# Patient Record
Sex: Male | Born: 1990 | Race: Black or African American | Hispanic: No | Marital: Single | Smoking: Current every day smoker
Health system: Southern US, Community
[De-identification: ages and names within clinical notes are randomized; demographics above are authoritative.]

---

## 2014-12-24 ENCOUNTER — Encounter (HOSPITAL_COMMUNITY): Payer: Self-pay | Admitting: Emergency Medicine

## 2014-12-24 ENCOUNTER — Emergency Department (HOSPITAL_COMMUNITY)
Admission: EM | Admit: 2014-12-24 | Discharge: 2014-12-24 | Disposition: A | Discharge status: Home / Self Care | Payer: Self-pay | Attending: Emergency Medicine | Admitting: Emergency Medicine

## 2014-12-24 DIAGNOSIS — R0602 Shortness of breath: Secondary | ICD-10-CM | POA: Insufficient documentation

## 2014-12-24 DIAGNOSIS — L819 Disorder of pigmentation, unspecified: Secondary | ICD-10-CM | POA: Insufficient documentation

## 2014-12-24 DIAGNOSIS — R109 Unspecified abdominal pain: Secondary | ICD-10-CM | POA: Insufficient documentation

## 2014-12-24 DIAGNOSIS — Z720 Tobacco use: Secondary | ICD-10-CM | POA: Insufficient documentation

## 2014-12-24 NOTE — Discharge Instructions (Signed)

## 2014-12-24 NOTE — ED Notes (Signed)
Patient states 2-3 weeks ago, perhaps as long ago as a month and a half ago, he was shaving with a dirty razor. Patient has small scar to inner right knee, states he believes he now has tetanus. Patient states the area is "spreading", he is having muscle contractions, and states he "almost had lockjaw" this morning. Patient states his last tetanus shot was just over 1 year ago.

## 2014-12-24 NOTE — ED Provider Notes (Signed)
CSN: [id number]     Arrival date & time 12/24/14  1911 History  This chart was scribed for non-physician provider Joycie PeekBenjamin Markies Mowatt, PA-C, working with Rolland PorterMark James, MD by Phillis HaggisGabriella Gaje, ED Scribe. This patient was seen in room WTR9/WTR9 and patient care was started at 7:40 PM.    Chief Complaint  Patient presents with  . Wound Check    left knee   Patient is a 24 y.o. male presenting with wound check. The history is provided by the patient. No language interpreter was used.  Wound Check Associated symptoms include abdominal pain and shortness of breath.   HPI Comments: Lawrence ReachChristopher Bean is a 24 y.o. male who presents to the Emergency Department requesting a wound check. He states that he accidentally cut himself shaving with a dirty razor in the inner right knee a month and a half ago and has since been experiencing muscle spasms, itching, and believes he experienced lockjaw this morning. He states that the scarred area has been spreading behind his knee. He states that he believes he has tetanus. Patient states that his last tetanus shot was one year ago. He reports some SOB and slight abdominal pain but believes that this might be attributed to anxiety related to the complaint . Patient reports that he is a smoker. Patient denies fever, numbness, or weakness. Patient denies any significant medical history but reports familial history of Lupus.   History reviewed. No pertinent past medical history. History reviewed. No pertinent past surgical history. No family history on file. History  Substance Use Topics  . Smoking status: Current Every Day Smoker -- 0.30 packs/day    Types: Cigarettes  . Smokeless tobacco: Not on file  . Alcohol Use: Yes     Comment: social    Review of Systems  Constitutional: Negative for fever.  Respiratory: Positive for shortness of breath.   Gastrointestinal: Positive for abdominal pain.  Skin: Positive for wound.  Neurological: Negative for weakness and  numbness.  All other systems reviewed and are negative.  Allergies  Review of patient's allergies indicates no known allergies.  Home Medications   Prior to Admission medications   Not on File   BP 146/96 mmHg  Pulse 106  Temp(Src) 98.6 F (37 C) (Oral)  Resp 20  SpO2 100%  Physical Exam  Constitutional: He is oriented to person, place, and time. He appears well-developed and well-nourished. No distress.  HENT:  Head: Normocephalic and atraumatic.  Mouth/Throat: Oropharynx is clear and moist. No oropharyngeal exudate.  No trismus.  Eyes: Conjunctivae and EOM are normal. Right eye exhibits no discharge. Left eye exhibits no discharge. No scleral icterus.  Neck: Normal range of motion. Neck supple.  Cardiovascular: Normal rate, regular rhythm and normal heart sounds.   Pulmonary/Chest: Effort normal and breath sounds normal. No respiratory distress. He has no wheezes. He has no rales.  Abdominal: Soft. There is no tenderness.  Musculoskeletal: Normal range of motion. He exhibits no edema or tenderness.  Neurological: He is alert and oriented to person, place, and time.  CN 2-12 grossly intact. Motor and Sensation baseline with no focal deficits.   Skin: Skin is warm and dry.  Small lesions <0.5cm with Mild hyperpigmentation to medial aspect of L knee. No erythema, edema, warmth.  Psychiatric: He has a normal mood and affect. His behavior is normal.  Pt appears anxious and nervous.  Nursing note and vitals reviewed.   ED Course  Procedures (including critical care time)  DIAGNOSTIC STUDIES: Oxygen Saturation  is 100% on room air, normal by my interpretation.    COORDINATION OF CARE: 7:45 PM-Discussed treatment plan which includes increasing intake of electrolytes with pt at bedside and pt agreed to plan.   Labs Review Labs Reviewed - No data to display  Imaging Review No results found.   EKG Interpretation None     Filed Vitals:   12/24/14 1916  BP: 146/96   Pulse: 106  Temp: 98.6 F (37 C)  TempSrc: Oral  Resp: 20  SpO2: 100%   Meds given in ED:  Medications - No data to display  There are no discharge medications for this patient.   MDM  Vitals stable - WNL -afebrile, no tachycardia on my exam. HR 90s. Previous tachycardia likely due to anxiety. Pt resting comfortably in ED. PE--Mild hyperpigmentation to medial aspect of L knee. Consistent with mild scarring. No evidence of infection. Full ROM. NVI  DDX--pt concerned about tetanus exposure over 1.5 months ago. Last Tetanus 1 yr ago. No trismus on exam. No spasm. No evidence of tetanus or other emergent pathology. DC with resource guide so pt may f/u with PCP.  I discussed all relevant lab findings and imaging results with pt and they verbalized understanding. Discussed f/u with PCP within 48 hrs and return precautions, pt very amenable to plan. Pt stable, in good condition and is appropriate for discharge.  Final diagnoses:  Pigmented skin lesion    I personally performed the services described in this documentation, which was scribed in my presence. The recorded information has been reviewed and is accurate.    Joycie Peek, PA-C 12/25/14 1348  Rolland Porter, MD 01/03/15 [phone]

## 2015-01-09 ENCOUNTER — Encounter (HOSPITAL_COMMUNITY): Payer: Self-pay

## 2015-01-09 ENCOUNTER — Emergency Department (HOSPITAL_COMMUNITY): Payer: BLUE CROSS/BLUE SHIELD

## 2015-01-09 ENCOUNTER — Emergency Department (HOSPITAL_COMMUNITY)
Admission: EM | Admit: 2015-01-09 | Discharge: 2015-01-10 | Disposition: A | Discharge status: Home / Self Care | Payer: BLUE CROSS/BLUE SHIELD | Attending: Emergency Medicine | Admitting: Emergency Medicine

## 2015-01-09 DIAGNOSIS — R079 Chest pain, unspecified: Secondary | ICD-10-CM

## 2015-01-09 DIAGNOSIS — R0789 Other chest pain: Secondary | ICD-10-CM | POA: Insufficient documentation

## 2015-01-09 DIAGNOSIS — Z720 Tobacco use: Secondary | ICD-10-CM | POA: Diagnosis not present

## 2015-01-09 LAB — I-STAT CHEM 8, ED
BUN: 14 mg/dL (ref 6–23)
CALCIUM ION: 1.08 mmol/L — AB (ref 1.12–1.23)
CREATININE: 0.9 mg/dL (ref 0.50–1.35)
Chloride: 100 mmol/L (ref 96–112)
GLUCOSE: 69 mg/dL — AB (ref 70–99)
HCT: 46 % (ref 39.0–52.0)
HEMOGLOBIN: 15.6 g/dL (ref 13.0–17.0)
Potassium: 3.4 mmol/L — ABNORMAL LOW (ref 3.5–5.1)
Sodium: 136 mmol/L (ref 135–145)
TCO2: 18 mmol/L (ref 0–100)

## 2015-01-09 LAB — I-STAT TROPONIN, ED: Troponin i, poc: 0 ng/mL (ref 0.00–0.08)

## 2015-01-09 LAB — CBC
HCT: 42.1 % (ref 39.0–52.0)
Hemoglobin: 14.2 g/dL (ref 13.0–17.0)
MCH: 31.8 pg (ref 26.0–34.0)
MCHC: 33.7 g/dL (ref 30.0–36.0)
MCV: 94.2 fL (ref 78.0–100.0)
PLATELETS: 235 10*3/uL (ref 150–400)
RBC: 4.47 MIL/uL (ref 4.22–5.81)
RDW: 13.3 % (ref 11.5–15.5)
WBC: 11.6 10*3/uL — ABNORMAL HIGH (ref 4.0–10.5)

## 2015-01-09 MED ORDER — GI COCKTAIL ~~LOC~~
30.0000 mL | Freq: Once | ORAL | Status: AC
  Administered 2015-01-09: 30 mL via ORAL
  Filled 2015-01-09: qty 30

## 2015-01-09 NOTE — ED Notes (Signed)
Patient transported to X-ray 

## 2015-01-09 NOTE — ED Notes (Signed)
MD at bedside. 

## 2015-01-09 NOTE — ED Notes (Signed)
Pt presents with c/o chest pain that started a few days ago and pain down his left arm. Pt denies any shortness of breath or dizziness.

## 2015-01-09 NOTE — ED Provider Notes (Signed)
TIME SEEN: 11:40 PM  CHIEF COMPLAINT: Chest pain  HPI: Pt is a 24 y.o. male with history of tobacco use who presents emergency department with left-sided chest pain that he describes as "gas pain" that radiates into his left arm that started at 2 PM today. No shortness of breath, nausea, vomiting, dizziness, diaphoresis. Has a family history of grandparents with MIs in their 340s. No other family history of premature CAD. Denies history of hypertension, diabetes, hyperlipidemia. No history of PE or DVT, recent prolonged immobilization such as long flight or hospitalization, fracture, surgery, trauma. No fever or cough. States he has been having indigestion and has been trying baking soda and water at home.  ROS: See HPI Constitutional: no fever  Eyes: no drainage  ENT: no runny nose   Cardiovascular:   chest pain  Resp: no SOB  GI: no vomiting GU: no dysuria Integumentary: no rash  Allergy: no hives  Musculoskeletal: no leg swelling  Neurological: no slurred speech ROS otherwise negative  PAST MEDICAL HISTORY/PAST SURGICAL HISTORY:  History reviewed. No pertinent past medical history.  MEDICATIONS:  Prior to Admission medications   Not on File    ALLERGIES:  No Known Allergies  SOCIAL HISTORY:  History  Substance Use Topics  . Smoking status: Current Every Day Smoker -- 0.30 packs/day    Types: Cigarettes  . Smokeless tobacco: Not on file  . Alcohol Use: Yes     Comment: social    FAMILY HISTORY: No family history on file.  EXAM: BP 131/88 mmHg  Pulse 95  Temp(Src) 98.2 F (36.8 C) (Oral)  Resp 11  SpO2 100% CONSTITUTIONAL: Alert and oriented and responds appropriately to questions. Well-appearing; well-nourished HEAD: Normocephalic EYES: Conjunctivae clear, PERRL ENT: normal nose; no rhinorrhea; moist mucous membranes; pharynx without lesions noted NECK: Supple, no meningismus, no LAD  CARD: RRR; S1 and S2 appreciated; no murmurs, no clicks, no rubs, no  gallops RESP: Normal chest excursion without splinting or tachypnea; breath sounds clear and equal bilaterally; no wheezes, no rhonchi, no rales, chest wall nontender to palpation without crepitus or ecchymosis or deformity ABD/GI: Normal bowel sounds; non-distended; soft, non-tender, no rebound, no guarding BACK:  The back appears normal and is non-tender to palpation, there is no CVA tenderness EXT: Normal ROM in all joints; non-tender to palpation; no edema; normal capillary refill; no cyanosis, no calf tenderness or swelling    SKIN: Normal color for age and race; warm NEURO: Moves all extremities equally PSYCH: The patient's mood and manner are appropriate. Grooming and personal hygiene are appropriate.  MEDICAL DECISION MAKING: Patient here with very atypical chest pain. He is PERC negative and has no risk factors for ACS other than tobacco use and family history. His EKG however does show some abnormalities but is likely secondary to early repolarization. Given pain is been constant since 2 PM we'll obtain one troponin, chest x-ray. We'll give GI cocktail given this is likely gastric in nature.  ED PROGRESS: Patient reports some improvement after GI cocktail. Troponin is negative. I do not feel he needs serial troponins given pain has been constant for several hours. Chest x-ray clear. Discharge with prescription for Protonix to use as needed. Discussed return precautions. He verbalizes understanding and is comfortable with plan.      EKG Interpretation  Date/Time:  Tuesday January 09 2015 23:16:57 EDT Ventricular Rate:  83 PR Interval:  121 QRS Duration: 101 QT Interval:  393 QTC Calculation: 462 R Axis:   65 Text  Interpretation:  Sinus rhythm Left ventricular hypertrophy J point elevation in diffuse leads No old for comparison Confirmed by Jeannemarie Sawaya,  DO, Aylen Rambert [phone]) on 01/09/2015 11:23:14 PM        Layla Maw Kamdyn Colborn, DO 01/10/15 1914

## 2015-01-10 LAB — BASIC METABOLIC PANEL
ANION GAP: 17 — AB (ref 5–15)
BUN: 15 mg/dL (ref 6–23)
CHLORIDE: 98 mmol/L (ref 96–112)
CO2: 21 mmol/L (ref 19–32)
Calcium: 9.3 mg/dL (ref 8.4–10.5)
Creatinine, Ser: 0.96 mg/dL (ref 0.50–1.35)
GFR calc non Af Amer: >90 mL/min (ref >90)
Glucose, Bld: 70 mg/dL (ref 70–99)
Potassium: 3.5 mmol/L (ref 3.5–5.1)
Sodium: 136 mmol/L (ref 135–145)

## 2015-01-10 MED ORDER — PANTOPRAZOLE SODIUM 40 MG PO TBEC: 40.0000 mg | DELAYED_RELEASE_TABLET | Freq: Every day | ORAL | Status: AC

## 2015-01-10 NOTE — Discharge Instructions (Signed)

## 2016-08-11 IMAGING — CR DG CHEST 2V
2 series · 2 of 2 positions shown · non-contrast
Comparison: None.

CLINICAL DATA: Chest pain

EXAM:
CHEST  2 VIEW

[w chest pa]
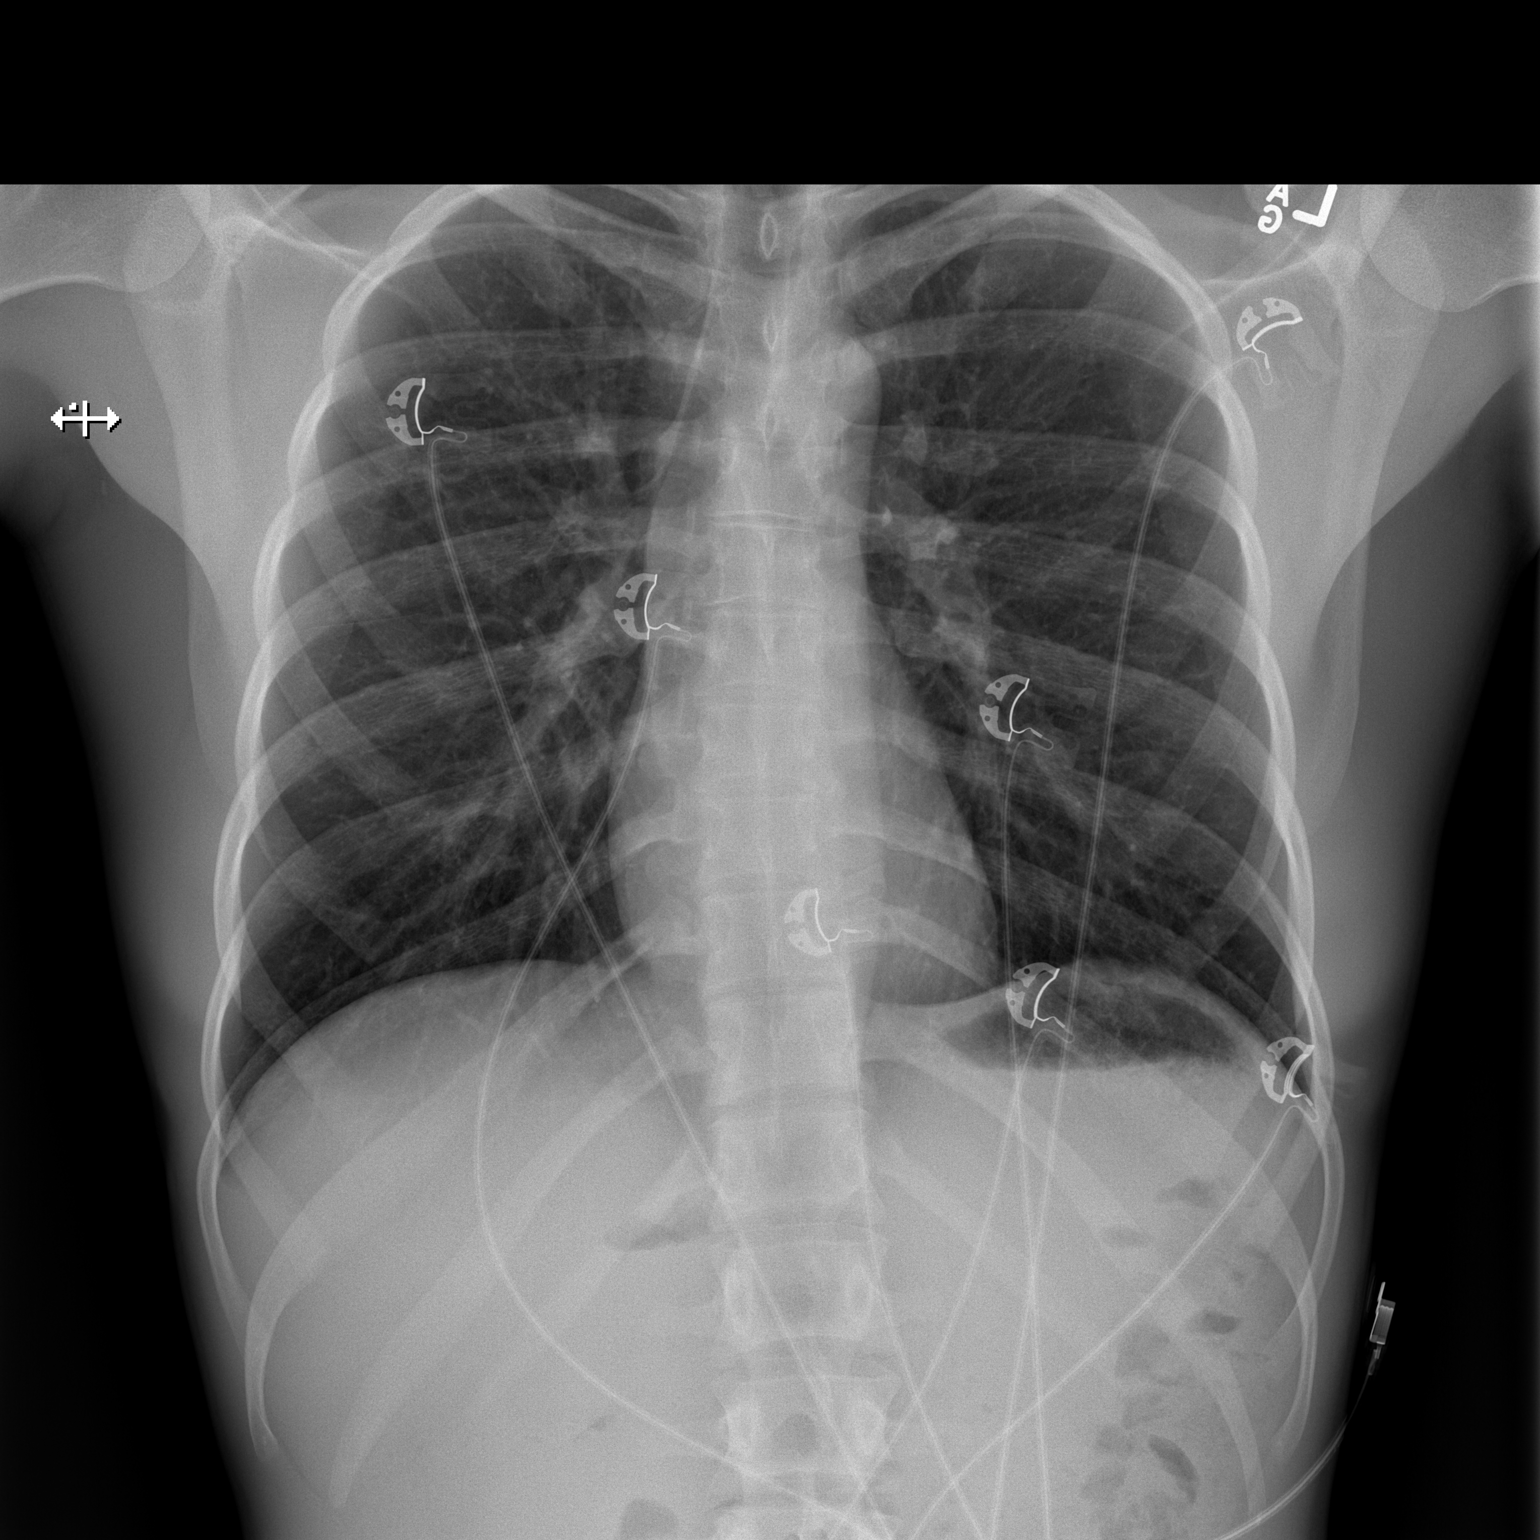

[w chest lat]
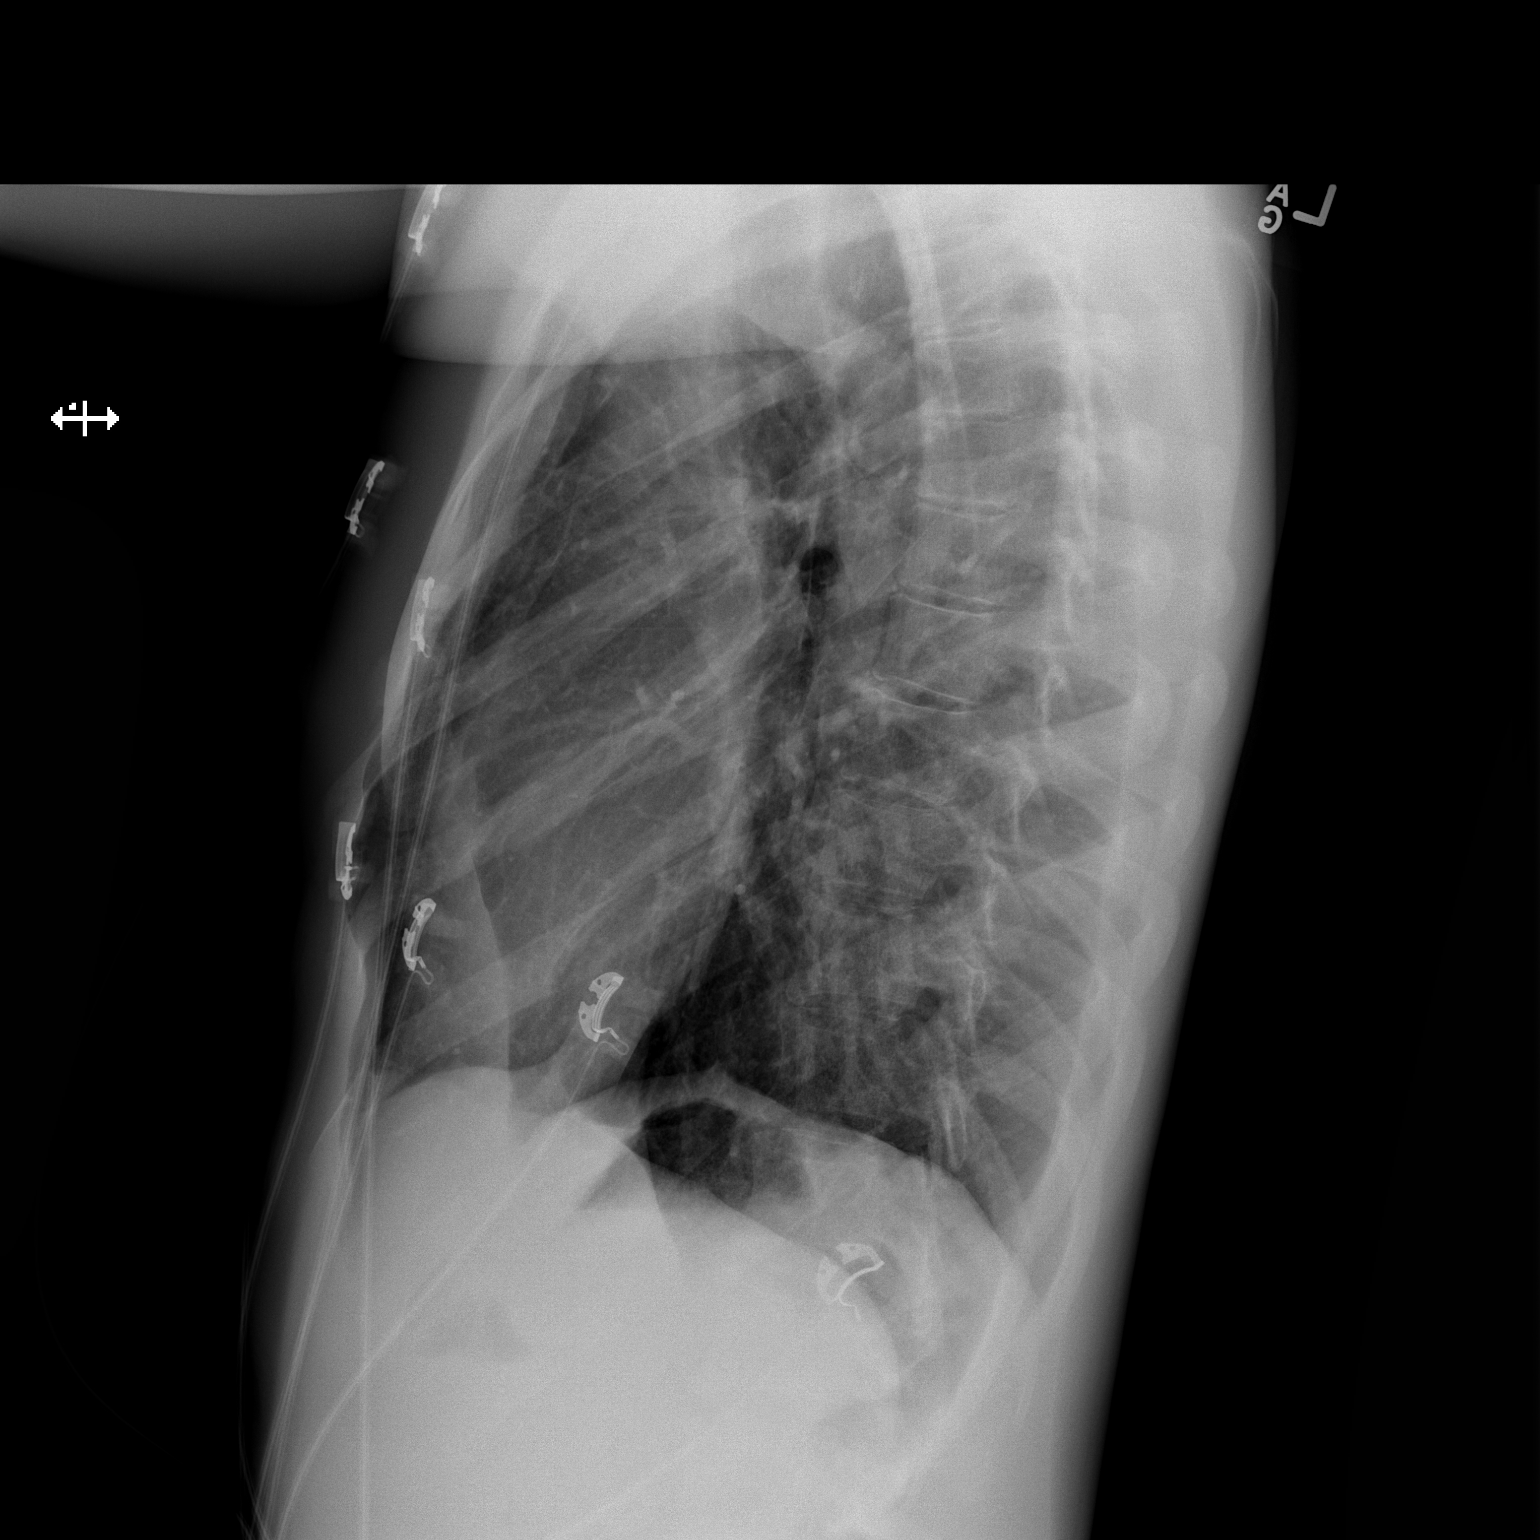

[2 of 2 positions shown; findings below may reference images not displayed]

FINDINGS: The heart size and mediastinal contours are within normal limits.
Both lungs are clear. The visualized skeletal structures are
unremarkable.
IMPRESSION: No radiographic evidence of active cardiopulmonary disease.
